# Patient Record
Sex: Female | Born: 1975 | Race: White | Hispanic: No | Marital: Married | Smoking: Never smoker
Health system: Southern US, Community
[De-identification: ages and names within clinical notes are randomized; demographics above are authoritative.]

## PROBLEM LIST (undated history)

## (undated) DIAGNOSIS — F419 Anxiety disorder, unspecified: Secondary | ICD-10-CM

## (undated) DIAGNOSIS — F5104 Psychophysiologic insomnia: Secondary | ICD-10-CM

## (undated) DIAGNOSIS — F909 Attention-deficit hyperactivity disorder, unspecified type: Secondary | ICD-10-CM

## (undated) DIAGNOSIS — N200 Calculus of kidney: Secondary | ICD-10-CM

## (undated) HISTORY — PX: TONSILLECTOMY: SUR1361

## (undated) HISTORY — PX: TUBAL LIGATION: SHX77

---

## 2004-12-16 ENCOUNTER — Ambulatory Visit: Payer: Self-pay

## 2005-05-07 ENCOUNTER — Ambulatory Visit: Payer: Self-pay | Admitting: Obstetrics & Gynecology

## 2005-07-22 ENCOUNTER — Inpatient Hospital Stay: Payer: Self-pay | Admitting: Obstetrics & Gynecology

## 2008-03-31 ENCOUNTER — Emergency Department (HOSPITAL_COMMUNITY): Admission: EM | Admit: 2008-03-31 | Discharge: 2008-03-31 | Payer: Self-pay | Admitting: Family Medicine

## 2008-06-30 ENCOUNTER — Emergency Department (HOSPITAL_COMMUNITY): Admission: EM | Admit: 2008-06-30 | Discharge: 2008-06-30 | Payer: Self-pay | Admitting: Family Medicine

## 2009-06-07 ENCOUNTER — Emergency Department (HOSPITAL_COMMUNITY): Admission: EM | Admit: 2009-06-07 | Discharge: 2009-06-07 | Payer: Self-pay | Admitting: Emergency Medicine

## 2010-11-10 ENCOUNTER — Inpatient Hospital Stay (INDEPENDENT_AMBULATORY_CARE_PROVIDER_SITE_OTHER)
Admission: RE | Admit: 2010-11-10 | Discharge: 2010-11-10 | Disposition: A | Discharge status: Home / Self Care | Payer: 59 | Source: Ambulatory Visit | Attending: Family Medicine | Admitting: Family Medicine

## 2010-11-10 DIAGNOSIS — J019 Acute sinusitis, unspecified: Secondary | ICD-10-CM

## 2010-11-10 DIAGNOSIS — J40 Bronchitis, not specified as acute or chronic: Secondary | ICD-10-CM

## 2011-02-10 IMAGING — CR DG CHEST 2V
2 series · 2 of 2 positions shown · non-contrast
Comparison: None.

CLINICAL DATA: 32-year-old female with fever, cough, congestion.
Right rib and sternum pain.

CHEST - 2 VIEW

[view not recorded (1 of 2)]
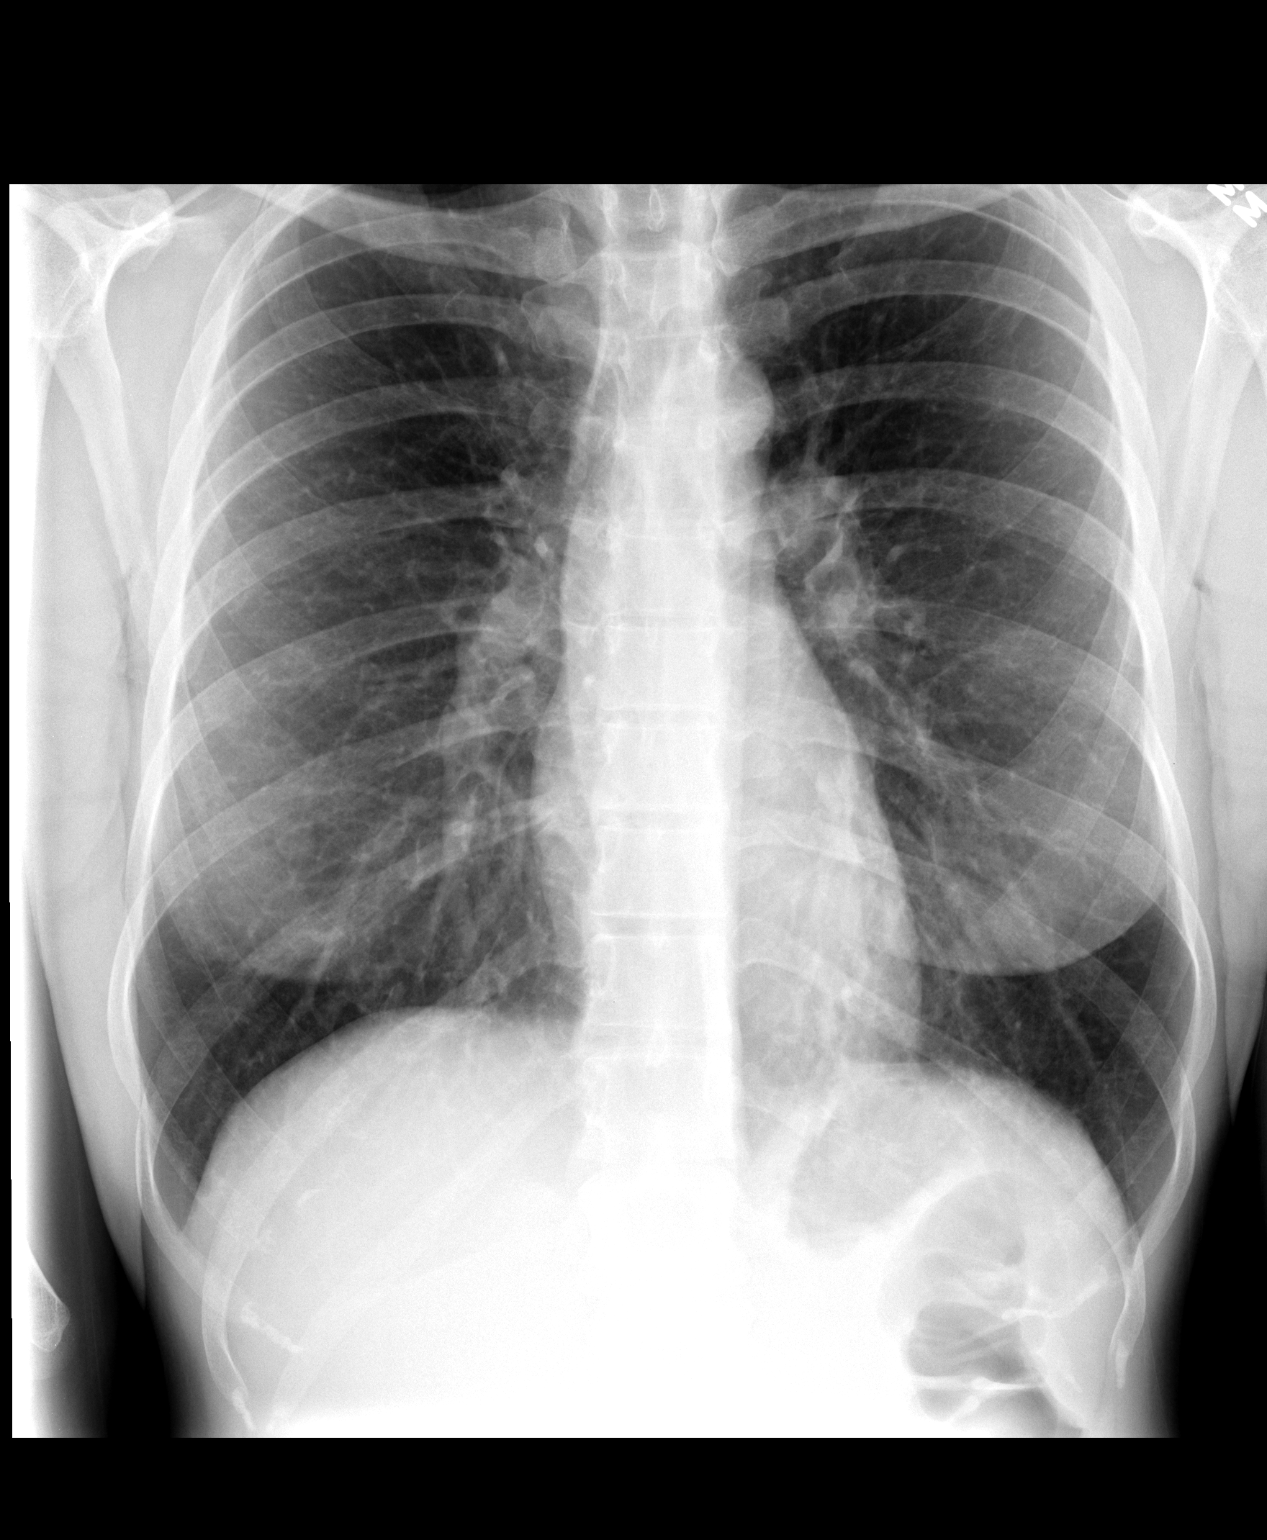

[view not recorded (2 of 2)]
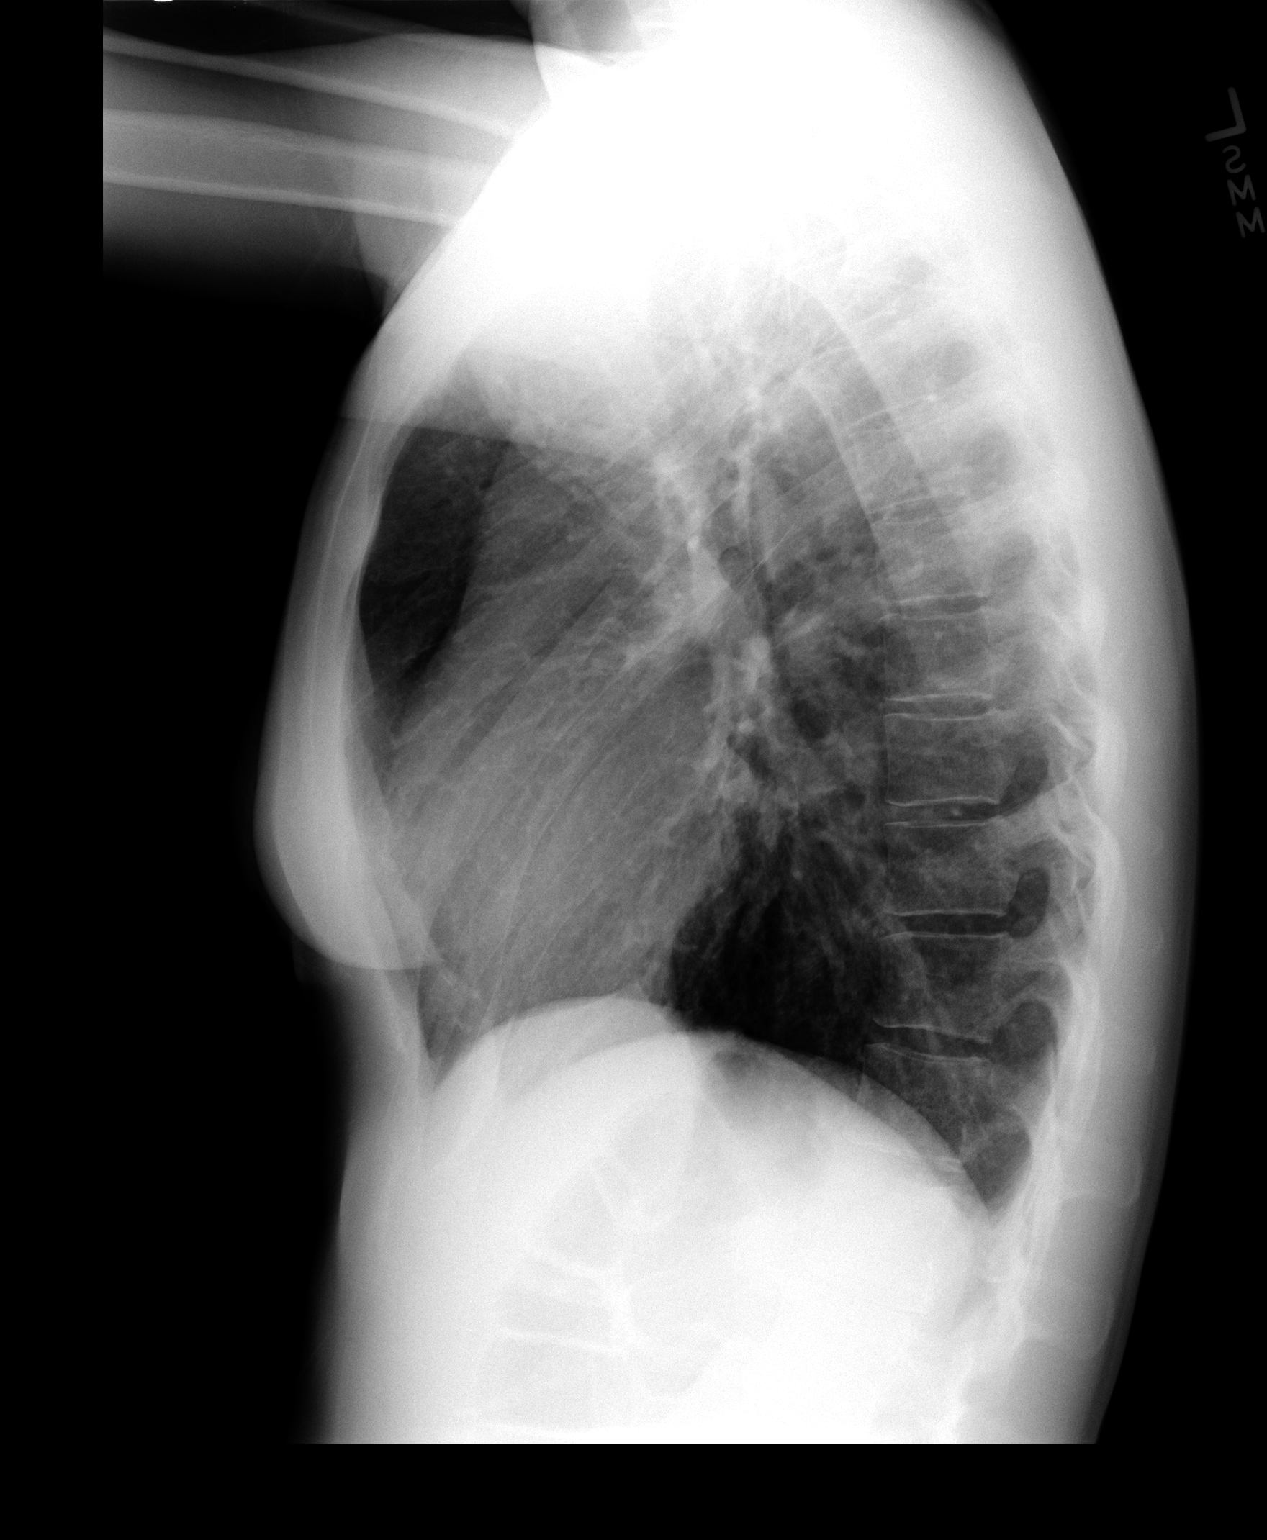

[2 of 2 positions shown; findings below may reference images not displayed]

FINDINGS: Normal cardiac size and mediastinal contours.  Anterior
clear space is preserved.  Lung volumes are within normal limits.
Visualized tracheal air column is within normal limits.  No
pneumothorax, pulmonary edema, pleural effusion, or consolidation.
No confluent airspace opacity, but there is mild diffuse increased
interstitial opacity. No acute osseous abnormality identified.  No
acute displaced rib fracture identified.
IMPRESSION: 1.  Mild diffuse increased interstitial markings may represent
acute viral / atypical pneumonia.
2. No acute displaced rib or other osseous fracture identified.

## 2011-04-01 ENCOUNTER — Ambulatory Visit: Payer: Self-pay | Admitting: Internal Medicine

## 2013-03-01 ENCOUNTER — Emergency Department (HOSPITAL_COMMUNITY)
Admission: EM | Admit: 2013-03-01 | Discharge: 2013-03-01 | Disposition: A | Discharge status: Home / Self Care | Payer: 59 | Source: Home / Self Care | Attending: Family Medicine | Admitting: Family Medicine

## 2013-03-01 ENCOUNTER — Encounter (HOSPITAL_COMMUNITY): Payer: Self-pay | Admitting: Emergency Medicine

## 2013-03-01 DIAGNOSIS — J029 Acute pharyngitis, unspecified: Secondary | ICD-10-CM

## 2013-03-01 LAB — POCT RAPID STREP A: STREPTOCOCCUS, GROUP A SCREEN (DIRECT): NEGATIVE

## 2013-03-01 NOTE — ED Provider Notes (Signed)
CSN: [id number]     Arrival date & time 03/01/13  1156 History   First MD Initiated Contact with Patient 03/01/13 1415     Chief Complaint  Patient presents with  . Sore Throat   (Consider location/radiation/quality/duration/timing/severity/associated sxs/prior Treatment) HPI Comments: 38 year old female presents complaining of sore throat. About a week and a half ago, she started to have some mild cold symptoms with dry cough and runny nose. These resolved, but then she started to get a sore throat. The sore throat started one week ago. She feels this mostly on the right side, and feels like her lymph nodes are swollen on the right side of her neck. She said this has gradually worsened over a week but is still very mild. No fevers, chills, NVD, shortness of breath, chest pain. She says overall she does not feel sick, but the sore throat is annoying her.  Patient is a 38 y.o. female presenting with pharyngitis.  Sore Throat Pertinent negatives include no chest pain, no abdominal pain and no shortness of breath.    History reviewed. No pertinent past medical history. History reviewed. No pertinent past surgical history. No family history on file. History  Substance Use Topics  . Smoking status: Not on file  . Smokeless tobacco: Not on file  . Alcohol Use: No   OB History   Grav Para Term Preterm Abortions TAB SAB Ect Mult Living                 Review of Systems  Constitutional: Negative for fever and chills.  HENT: Positive for rhinorrhea and sore throat.   Eyes: Negative for visual disturbance.  Respiratory: Positive for cough. Negative for shortness of breath.   Cardiovascular: Negative for chest pain, palpitations and leg swelling.  Gastrointestinal: Negative for nausea, vomiting and abdominal pain.  Endocrine: Negative for polydipsia and polyuria.  Genitourinary: Negative for dysuria, urgency and frequency.  Musculoskeletal: Negative for arthralgias and myalgias.  Skin:  Negative for rash.  Neurological: Negative for dizziness, weakness and light-headedness.    Allergies  Penicillins  Home Medications  No current outpatient prescriptions on file. BP 132/95  Pulse 95  Temp(Src) 98.2 F (36.8 C) (Oral)  Resp 18  SpO2 100% Physical Exam  Nursing note and vitals reviewed. Constitutional: She is oriented to person, place, and time. Vital signs are normal. She appears well-developed and well-nourished. No distress.  HENT:  Head: Normocephalic and atraumatic.  Nose: Nose normal. Right sinus exhibits no maxillary sinus tenderness and no frontal sinus tenderness. Left sinus exhibits no maxillary sinus tenderness and no frontal sinus tenderness.  Mouth/Throat: Uvula is midline and oropharynx is clear and moist. No oropharyngeal exudate, posterior oropharyngeal edema, posterior oropharyngeal erythema or tonsillar abscesses.  Neck: Normal range of motion. Neck supple.  Pulmonary/Chest: Effort normal. No respiratory distress.  Lymphadenopathy:    She has no cervical adenopathy.  Neurological: She is alert and oriented to person, place, and time. She has normal strength. Coordination normal.  Skin: Skin is warm and dry. No rash noted. She is not diaphoretic.  Psychiatric: She has a normal mood and affect. Judgment normal.    ED Course  Procedures (including critical care time) Labs Review Labs Reviewed  POCT RAPID STREP A ([facility]RG CARE ONLY)   Imaging Review No results found.    MDM   1. Viral pharyngitis    Physical exam is completely normal. Treat symptomatically. Followup in a few days if this continues to worsen  Graylon Good, PA-C 03/01/13 1504

## 2013-03-01 NOTE — ED Notes (Signed)
C/o blisters in the back of her throat, lasting about 1 week. Denies any pain at the moment, hurts when she swallows. Red and soreness, runny nose. Tried Tylenol with no relief. 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 Denies any other sx's. Written by: Marga MelnickQuaNeisha Jones, SMA

## 2013-03-01 NOTE — Discharge Instructions (Signed)
Antibiotic Nonuse  Your caregiver felt that the infection or problem was not one that would be helped with an antibiotic. Infections may be caused by viruses or bacteria. Only a caregiver can tell which one of these is the likely cause of an illness. A cold is the most common cause of infection in both adults and children. A cold is a virus. Antibiotic treatment will have no effect on a viral infection. Viruses can lead to many lost days of work caring for sick children and many missed days of school. Children may catch as many as 10 "colds" or "flus" per year during which they can be tearful, cranky, and uncomfortable. The goal of treating a virus is aimed at keeping the ill person comfortable. Antibiotics are medications used to help the body fight bacterial infections. There are relatively few types of bacteria that cause infections but there are hundreds of viruses. While both viruses and bacteria cause infection they are very different types of germs. A viral infection will typically go away by itself within 7 to 10 days. Bacterial infections may spread or get worse without antibiotic treatment. Examples of bacterial infections are:  Sore throats (like strep throat or tonsillitis).  Infection in the lung (pneumonia).  Ear and skin infections. Examples of viral infections are:  Colds or flus.  Most coughs and bronchitis.  Sore throats not caused by Strep.  Runny noses. It is often best not to take an antibiotic when a viral infection is the cause of the problem. Antibiotics can kill off the helpful bacteria that we have inside our body and allow harmful bacteria to start growing. Antibiotics can cause side effects such as allergies, nausea, and diarrhea without helping to improve the symptoms of the viral infection. Additionally, repeated uses of antibiotics can cause bacteria inside of our body to become resistant. That resistance can be passed onto harmful bacterial. The next time you have  an infection it may be harder to treat if antibiotics are used when they are not needed. Not treating with antibiotics allows our own immune system to develop and take care of infections more efficiently. Also, antibiotics will work better for [place] when they are prescribed for bacterial infections. Treatments for a child that is ill may include:  Give extra fluids throughout the day to stay hydrated.  Get plenty of rest.  Only give your child over-the-counter or prescription medicines for pain, discomfort, or fever as directed by your caregiver.  The use of a cool mist humidifier may help stuffy noses.  Cold medications if suggested by your caregiver. Your caregiver may decide to start you on an antibiotic if:  The problem you were seen for today continues for a longer length of time than expected.  You develop a secondary bacterial infection. SEEK MEDICAL CARE IF:  Fever lasts longer than 5 days.  Symptoms continue to get worse after 5 to 7 days or become severe.  Difficulty in breathing develops.  Signs of dehydration develop (poor drinking, rare urinating, dark colored urine).  Changes in behavior or worsening tiredness (listlessness or lethargy). Document Released: 04/26/2001 Document Revised: 05/10/2011 Document Reviewed: 10/23/2008 [facility] Patient Information 2014 [place], [place].  Viral and Bacterial Pharyngitis Pharyngitis is soreness (inflammation) or infection of the pharynx. It is also called a sore throat. CAUSES  Most sore throats are caused by viruses and are part of a cold. However, some sore throats are caused by strep and other bacteria. Sore throats can also be caused by post nasal  drip from draining sinuses, allergies and sometimes from sleeping with an open mouth. Infectious sore throats can be spread from person to person by coughing, sneezing and sharing cups or eating utensils. TREATMENT  Sore throats that are viral usually last 3-4 days. Viral illness will  get better without medications (antibiotics). Strep throat and other bacterial infections will usually begin to get better about 24-48 hours after you begin to take antibiotics. HOME CARE INSTRUCTIONS   If the caregiver feels there is a bacterial infection or if there is a positive strep test, they will prescribe an antibiotic. The full course of antibiotics must be taken. If the full course of antibiotic is not taken, you or your child may become ill again. If you or your child has strep throat and do not finish all of the medication, serious heart or kidney diseases may develop.  Drink enough water and fluids to keep your urine clear or pale yellow.  Only take over-the-counter or prescription medicines for pain, discomfort or fever as directed by your caregiver.  Get lots of rest.  Gargle with salt water ( tsp. of salt in a glass of water) as often as every 1-2 hours as you need for comfort.  Hard candies may soothe the throat if individual is not at risk for choking. Throat sprays or lozenges may also be used. SEEK MEDICAL CARE IF:   Large, tender lumps in the neck develop.  A rash develops.  Green, yellow-brown or bloody sputum is coughed up.  Your baby is older than 3 months with a rectal temperature of 100.5 F (38.1 C) or higher for more than 1 day. SEEK IMMEDIATE MEDICAL CARE IF:   A stiff neck develops.  You or your child are drooling or unable to swallow liquids.  You or your child are vomiting, unable to keep medications or liquids down.  You or your child has severe pain, unrelieved with recommended medications.  You or your child are having difficulty breathing (not due to stuffy nose).  You or your child are unable to fully open your mouth.  You or your child develop redness, swelling, or severe pain anywhere on the neck.  You have a fever.  Your baby is older than 3 months with a rectal temperature of 102 F (38.9 C) or higher.  Your baby is 353 months old  or younger with a rectal temperature of 100.4 F (38 C) or higher. MAKE SURE YOU:   Understand these instructions.  Will watch your condition.  Will get help right away if you are not doing well or get worse. Document Released: 02/15/2005 Document Revised: 05/10/2011 Document Reviewed: 05/15/2007 [facility] Patient Information 2014 [place], [place].

## 2013-03-02 NOTE — ED Provider Notes (Signed)
Medical screening examination/treatment/procedure(s) were performed by resident physician or non-physician practitioner and as supervising physician I was immediately available for consultation/collaboration.   Kayci Belleville DOUGLAS MD.   Jett Fukuda D Lenda Baratta, MD 03/02/13 0943 

## 2013-03-03 LAB — CULTURE, GROUP A STREP

## 2013-11-07 ENCOUNTER — Other Ambulatory Visit (HOSPITAL_COMMUNITY): Payer: Self-pay | Admitting: Neurosurgery

## 2013-11-07 DIAGNOSIS — M5416 Radiculopathy, lumbar region: Secondary | ICD-10-CM

## 2013-11-21 ENCOUNTER — Ambulatory Visit (HOSPITAL_COMMUNITY): Admission: RE | Admit: 2013-11-21 | Payer: 59 | Source: Ambulatory Visit

## 2015-03-30 ENCOUNTER — Encounter: Payer: Self-pay | Admitting: Gynecology

## 2015-03-30 ENCOUNTER — Ambulatory Visit
Admission: EM | Admit: 2015-03-30 | Discharge: 2015-03-30 | Disposition: A | Discharge status: Home / Self Care | Payer: 59 | Attending: Family Medicine | Admitting: Family Medicine

## 2015-03-30 DIAGNOSIS — J0100 Acute maxillary sinusitis, unspecified: Secondary | ICD-10-CM

## 2015-03-30 HISTORY — DX: Psychophysiologic insomnia: F51.04

## 2015-03-30 HISTORY — DX: Hypercalcemia: E83.52

## 2015-03-30 HISTORY — DX: Anxiety disorder, unspecified: F41.9

## 2015-03-30 HISTORY — DX: Calculus of kidney: N20.0

## 2015-03-30 HISTORY — DX: Attention-deficit hyperactivity disorder, unspecified type: F90.9

## 2015-03-30 MED ORDER — FLUTICASONE PROPIONATE 50 MCG/ACT NA SUSP: 2.0000 | Freq: Every day | NASAL | Status: DC

## 2015-03-30 MED ORDER — SUPRAX 400 MG PO TABS: 400.0000 mg | ORAL_TABLET | Freq: Every day | ORAL | Status: DC

## 2015-03-30 MED ORDER — FEXOFENADINE-PSEUDOEPHED ER 180-240 MG PO TB24: 1.0000 | ORAL_TABLET | Freq: Every day | ORAL | Status: DC

## 2015-03-30 MED ORDER — PREDNISONE 10 MG (21) PO TBPK: ORAL_TABLET | ORAL | Status: DC

## 2015-03-30 NOTE — ED Provider Notes (Signed)
CSN: [id number]     Arrival date & time 03/30/15  1106 History   First MD Initiated Contact with Patient 03/30/15 1201    Nurses notes were reviewed. Chief Complaint  Patient presents with  . Facial Pain   Patient with facial pain. She reports pain on the left side of her face. She reports having sinus trouble about 8 days ago last week Sunday. She thought things were getting better but they quickly returned about Wednesday last week. She's been using in a pot and last days and use some Afrin nasal spray without improvement. She normally takes Allegra on a regular basis but she needs something for sinuses but she doesn't usually have sinus infections. She does not smoke and does not live with her regular smoker either.  She is allergic penicillin history of some insomnia and anxiety.  No sniffing family medical history pertinent to this visit. No chronic medications at this time  (Consider location/radiation/quality/duration/timing/severity/associated sxs/prior Treatment) Patient is a 40 y.o. female presenting with URI. The history is provided by the patient. No language interpreter was used.  URI Presenting symptoms: congestion, cough, facial pain and rhinorrhea   Presenting symptoms: no ear pain, no fatigue, no fever and no sore throat   Severity:  Moderate Duration:  8 days Timing:  Constant Progression:  Worsening Chronicity:  New Relieved by:  Nothing Ineffective treatments:  Decongestant, inhaler and OTC medications (Netty pot ineffective as as afrain) Associated symptoms: headaches, sinus pain and swollen glands   Risk factors: diabetes mellitus   Risk factors: not elderly, no chronic respiratory disease, no immunosuppression and no recent illness     Past Medical History  Diagnosis Date  . ADHD (attention deficit hyperactivity disorder)   . Chronic insomnia   . Anxiety   . Hypercalcemia   . Recurrent nephrolithiasis    Past Surgical History  Procedure Laterality Date   . Tonsillectomy    . Tubal ligation     No family history on file. Social History  Substance Use Topics  . Smoking status: Never Smoker   . Smokeless tobacco: None  . Alcohol Use: No   OB History    No data available     Review of Systems  Constitutional: Negative for fever and fatigue.  HENT: Positive for congestion and rhinorrhea. Negative for ear pain and sore throat.   Respiratory: Positive for cough.   Neurological: Positive for headaches.  All other systems reviewed and are negative.   Allergies  Penicillins  Home Medications   Prior to Admission medications   Medication Sig Start Date End Date Taking? Authorizing Provider  amphetamine-dextroamphetamine (ADDERALL) 20 MG tablet Take 20 mg by mouth daily.   Yes Historical Provider, MD  fexofenadine-pseudoephedrine (ALLEGRA-D ALLERGY & CONGESTION) 180-240 MG 24 hr tablet Take 1 tablet by mouth daily. 03/30/15   Hassan Rowan, MD  fluticasone (FLONASE) 50 MCG/ACT nasal spray Place 2 sprays into both nostrils daily. 03/30/15   Hassan Rowan, MD  predniSONE (STERAPRED UNI-PAK 21 TAB) 10 MG (21) TBPK tablet Sig 6 tablet day 1, 5 tablets day 2, 4 tablets day 3,,3tablets day 4, 2 tablets day 5, 1 tablet day 6 take all tablets orally 03/30/15   Hassan Rowan, MD  SUPRAX 400 MG tablet Take 1 tablet (400 mg total) by mouth daily. Patient may have instructed Suprax 200 mg 2 chewable tablets daily #20 for 10 days if cost is an issue 03/30/15   Hassan Rowan, MD   Meds Ordered and Administered this  Visit  Medications - No data to display  BP 136/96 mmHg  Pulse 92  Temp(Src) 98.1 F (36.7 C) (Oral)  Resp 16  Ht  (1.651 m)  Wt 135 lb (61.236 kg)  BMI 22.47 kg/m2  SpO2 100%  LMP 03/23/2015 No data found.   Physical Exam  Constitutional: She is oriented to person, place, and time. She appears well-developed and well-nourished.  HENT:  Head: Normocephalic and atraumatic.  Right Ear: Hearing, tympanic membrane and external ear  normal.  Left Ear: Hearing, tympanic membrane and external ear normal.  Nose: Right sinus exhibits maxillary sinus tenderness. Left sinus exhibits maxillary sinus tenderness.  Mouth/Throat: Uvula is midline. Posterior oropharyngeal erythema present.  Increase swelling over the left side of her face and more tenderness over the left maxillary sinus area in the right  Neck: Normal range of motion.  Cardiovascular: Normal rate and regular rhythm.   Pulmonary/Chest: Effort normal and breath sounds normal.  Musculoskeletal: Normal range of motion.  Lymphadenopathy:    She has cervical adenopathy.  Neurological: She is alert and oriented to person, place, and time.  Skin: Skin is warm and dry.  Psychiatric: She has a normal mood and affect.  Vitals reviewed.   ED Course  Procedures (including critical care time)  Labs Review Labs Reviewed - No data to display  Imaging Review No results found.   Visual Acuity Review  Right Eye Distance:   Left Eye Distance:   Bilateral Distance:    Right Eye Near:   Left Eye Near:    Bilateral Near:         MDM   1. Acute maxillary sinusitis, recurrence not specified    Due to the significant symptoms of her sinus infection and her allergies penicillin will place on Suprax 400 mg a day for 10 days change Allegra to Allegra-D trace on oral prednisone and Flonase nasal spray. Follow-up with PCP of choice if not better in a week to 10 days work note given for today and tomorrow as well. Strongly suggest she stay away from Afrin nasal sprays as well.   Note: This dictation was prepared with Dragon dictation along with smaller phrase technology. Any transcriptional errors that result from this process are unintentional.       Hassan Rowan, MD 03/30/15 1312

## 2015-03-30 NOTE — ED Notes (Signed)
Patient c/o facial pressure / drainage x 1 week.

## 2015-03-30 NOTE — Discharge Instructions (Signed)
Sinusitis, Adult °Sinusitis is redness, soreness, and puffiness (inflammation) of the air pockets in the bones of your face (sinuses). The redness, soreness, and puffiness can cause air and mucus to get trapped in your sinuses. This can allow germs to grow and cause an infection.  °HOME CARE  °· Drink enough fluids to keep your pee (urine) clear or pale yellow. °· Use a humidifier in your home. °· Run a hot shower to create steam in the bathroom. Sit in the bathroom with the door closed. Breathe in the steam 3-4 times a day. °· Put a warm, moist washcloth on your face 3-4 times a day, or as told by your doctor. °· Use salt water sprays (saline sprays) to wet the thick fluid in your nose. This can help the sinuses drain. °· Only take medicine as told by your doctor. °GET HELP RIGHT AWAY IF:  °· Your pain gets worse. °· You have very bad headaches. °· You are sick to your stomach (nauseous). °· You throw up (vomit). °· You are very sleepy (drowsy) all the time. °· Your face is puffy (swollen). °· Your vision changes. °· You have a stiff neck. °· You have trouble breathing. °MAKE SURE YOU:  °· Understand these instructions. °· Will watch your condition. °· Will get help right away if you are not doing well or get worse. °  °This information is not intended to replace advice given to you by your health care provider. Make sure you discuss any questions you have with your health care provider. °  °Document Released: 08/04/2007 Document Revised: 03/08/2014 Document Reviewed: 09/21/2011 °Elsevier Interactive Patient Education ©2016 [employer] Inc. ° °Upper Respiratory Infection, Adult °Most upper respiratory infections (URIs) are caused by a virus. A URI affects the nose, throat, and upper air passages. The most common type of URI is often called "the common cold." °HOME CARE  °· Take medicines only as told by your doctor. °· Gargle warm saltwater or take cough drops to comfort your throat as told by your doctor. °· Use a  warm mist humidifier or inhale steam from a shower to increase air moisture. This may make it easier to breathe. °· Drink enough fluid to keep your pee (urine) clear or pale yellow. °· Eat soups and other clear broths. °· Have a healthy diet. °· Rest as needed. °· Go back to work when your fever is gone or your doctor says it is okay. °¨ You may need to stay home longer to avoid giving your URI to others. °¨ You can also wear a face mask and wash your hands often to prevent spread of the virus. °· Use your inhaler more if you have asthma. °· Do not use any tobacco products, including cigarettes, chewing tobacco, or electronic cigarettes. If you need help quitting, ask your doctor. °GET HELP IF: °· You are getting worse, not better. °· Your symptoms are not helped by medicine. °· You have chills. °· You are getting more short of breath. °· You have brown or red mucus. °· You have yellow or brown discharge from your nose. °· You have pain in your face, especially when you bend forward. °· You have a fever. °· You have puffy (swollen) neck glands. °· You have pain while swallowing. °· You have white areas in the back of your throat. °GET HELP RIGHT AWAY IF:  °· You have very bad or constant: °¨ Headache. °¨ Ear pain. °¨ Pain in your forehead, behind your eyes, and over   your cheekbones (sinus pain). °¨ Chest pain. °· You have long-lasting (chronic) lung disease and any of the following: °¨ Wheezing. °¨ Long-lasting cough. °¨ Coughing up blood. °¨ A change in your usual mucus. °· You have a stiff neck. °· You have changes in your: °¨ Vision. °¨ Hearing. °¨ Thinking. °¨ Mood. °MAKE SURE YOU:  °· Understand these instructions. °· Will watch your condition. °· Will get help right away if you are not doing well or get worse. °  °This information is not intended to replace advice given to you by your health care provider. Make sure you discuss any questions you have with your health care provider. °  °Document Released:  08/04/2007 Document Revised: 07/02/2014 Document Reviewed: 05/23/2013 °Elsevier Interactive Patient Education ©2016 Elsevier Inc. ° °

## 2015-04-02 DIAGNOSIS — J019 Acute sinusitis, unspecified: Secondary | ICD-10-CM | POA: Diagnosis not present

## 2015-04-02 DIAGNOSIS — F988 Other specified behavioral and emotional disorders with onset usually occurring in childhood and adolescence: Secondary | ICD-10-CM | POA: Diagnosis not present

## 2015-04-02 DIAGNOSIS — F909 Attention-deficit hyperactivity disorder, unspecified type: Secondary | ICD-10-CM | POA: Diagnosis not present

## 2015-04-02 DIAGNOSIS — B9689 Other specified bacterial agents as the cause of diseases classified elsewhere: Secondary | ICD-10-CM | POA: Diagnosis not present

## 2015-04-16 MED FILL — levoFLOXacin 500 MG TABS: 500 | 10 days supply | Qty: 10 | Fill #0

## 2015-07-16 MED FILL — DEXTROAMP-AMPHETAMIN 20 MG: 20 | 30 days supply | Qty: 60 | Fill #0

## 2015-09-30 DIAGNOSIS — F902 Attention-deficit hyperactivity disorder, combined type: Secondary | ICD-10-CM | POA: Diagnosis not present

## 2015-09-30 DIAGNOSIS — F5104 Psychophysiologic insomnia: Secondary | ICD-10-CM | POA: Diagnosis not present

## 2015-09-30 DIAGNOSIS — F909 Attention-deficit hyperactivity disorder, unspecified type: Secondary | ICD-10-CM | POA: Diagnosis not present

## 2016-03-05 DIAGNOSIS — F902 Attention-deficit hyperactivity disorder, combined type: Secondary | ICD-10-CM | POA: Diagnosis not present

## 2016-03-05 DIAGNOSIS — Z79899 Other long term (current) drug therapy: Secondary | ICD-10-CM | POA: Diagnosis not present

## 2016-03-05 DIAGNOSIS — F418 Other specified anxiety disorders: Secondary | ICD-10-CM | POA: Diagnosis not present

## 2016-03-05 DIAGNOSIS — Z0000 Encounter for general adult medical examination without abnormal findings: Secondary | ICD-10-CM | POA: Diagnosis not present

## 2016-03-09 DIAGNOSIS — Z13220 Encounter for screening for lipoid disorders: Secondary | ICD-10-CM | POA: Diagnosis not present

## 2016-03-09 DIAGNOSIS — R300 Dysuria: Secondary | ICD-10-CM | POA: Diagnosis not present

## 2016-03-09 DIAGNOSIS — Z79899 Other long term (current) drug therapy: Secondary | ICD-10-CM | POA: Diagnosis not present

## 2016-03-09 DIAGNOSIS — Z1329 Encounter for screening for other suspected endocrine disorder: Secondary | ICD-10-CM | POA: Diagnosis not present

## 2016-06-18 MED FILL — ALPRAZolam 0.5 MG TABS: 0.5 | 90 days supply | Qty: 180 | Fill #0

## 2016-06-18 MED FILL — AMPHETAMINE SALTS 20 MG TAB: 20 | 30 days supply | Qty: 60 | Fill #0

## 2016-09-07 DIAGNOSIS — F902 Attention-deficit hyperactivity disorder, combined type: Secondary | ICD-10-CM | POA: Diagnosis not present

## 2016-09-07 DIAGNOSIS — F418 Other specified anxiety disorders: Secondary | ICD-10-CM | POA: Diagnosis not present

## 2017-03-25 DIAGNOSIS — F5104 Psychophysiologic insomnia: Secondary | ICD-10-CM | POA: Diagnosis not present

## 2017-03-25 DIAGNOSIS — Z1231 Encounter for screening mammogram for malignant neoplasm of breast: Secondary | ICD-10-CM | POA: Diagnosis not present

## 2017-03-25 DIAGNOSIS — F902 Attention-deficit hyperactivity disorder, combined type: Secondary | ICD-10-CM | POA: Diagnosis not present

## 2017-03-25 DIAGNOSIS — F418 Other specified anxiety disorders: Secondary | ICD-10-CM | POA: Diagnosis not present

## 2017-03-28 DIAGNOSIS — Z79899 Other long term (current) drug therapy: Secondary | ICD-10-CM | POA: Diagnosis not present

## 2017-03-28 DIAGNOSIS — Z13220 Encounter for screening for lipoid disorders: Secondary | ICD-10-CM | POA: Diagnosis not present

## 2017-03-28 DIAGNOSIS — N390 Urinary tract infection, site not specified: Secondary | ICD-10-CM | POA: Diagnosis not present

## 2019-12-07 ENCOUNTER — Ambulatory Visit: Payer: 59 | Attending: Internal Medicine

## 2019-12-07 ENCOUNTER — Other Ambulatory Visit: Payer: Self-pay | Admitting: Internal Medicine

## 2019-12-07 DIAGNOSIS — Z23 Encounter for immunization: Secondary | ICD-10-CM

## 2019-12-07 NOTE — Progress Notes (Signed)
   Covid-19 Vaccination Clinic  Name:  Yvonne Snyder    MRN: [id number] DOB: 1975/10/23  12/07/2019  Yvonne Snyder was observed post Covid-19 immunization for 15 minutes without incident. She was provided with Vaccine Information Sheet and instruction to access the V-Safe system.   Yvonne Snyder was instructed to call 911 with any severe reactions post vaccine: Marland Kitchen Difficulty breathing  . Swelling of face and throat  . A fast heartbeat  . A bad rash all over body  . Dizziness and weakness   Immunizations Administered    Name Date Dose VIS Date Route   Pfizer COVID-19 Vaccine 12/07/2019 11:42 AM 0.3 mL 04/25/2018 Intramuscular   Manufacturer: [employer], [employer]   Lot: [id number]   NDC: [address]-2

## 2019-12-28 ENCOUNTER — Other Ambulatory Visit: Payer: Self-pay | Admitting: Internal Medicine

## 2019-12-28 ENCOUNTER — Ambulatory Visit: Payer: 59 | Attending: Internal Medicine

## 2019-12-28 DIAGNOSIS — Z23 Encounter for immunization: Secondary | ICD-10-CM

## 2019-12-28 NOTE — Progress Notes (Signed)
   Covid-19 Vaccination Clinic  Name:  AUSTRALIA DROLL    MRN: [id number] DOB: 08/13/1975  12/28/2019  Ms. Karnes was observed post Covid-19 immunization for 15 minutes without incident. She was provided with Vaccine Information Sheet and instruction to access the V-Safe system.   Ms. Farfan was instructed to call 911 with any severe reactions post vaccine: Marland Kitchen Difficulty breathing  . Swelling of face and throat  . A fast heartbeat  . A bad rash all over body  . Dizziness and weakness   Immunizations Administered    Name Date Dose VIS Date Route   Pfizer COVID-19 Vaccine 12/28/2019  9:01 AM 0.3 mL 12/19/2019 Intramuscular   Manufacturer: [employer], [employer]   Lot: [id number]   NDC: [address]-9

## 2020-01-23 ENCOUNTER — Other Ambulatory Visit: Payer: Self-pay | Admitting: Family Medicine

## 2020-05-28 ENCOUNTER — Other Ambulatory Visit: Payer: Self-pay | Admitting: Internal Medicine

## 2020-05-28 DIAGNOSIS — Z1231 Encounter for screening mammogram for malignant neoplasm of breast: Secondary | ICD-10-CM

## 2021-02-18 ENCOUNTER — Other Ambulatory Visit: Payer: Self-pay

## 2021-02-18 ENCOUNTER — Emergency Department: Payer: Medicaid Other

## 2021-02-18 ENCOUNTER — Encounter: Payer: Self-pay | Admitting: Emergency Medicine

## 2021-02-18 DIAGNOSIS — I82622 Acute embolism and thrombosis of deep veins of left upper extremity: Secondary | ICD-10-CM | POA: Diagnosis not present

## 2021-02-18 DIAGNOSIS — M79602 Pain in left arm: Secondary | ICD-10-CM | POA: Diagnosis present

## 2021-02-18 DIAGNOSIS — E876 Hypokalemia: Secondary | ICD-10-CM | POA: Insufficient documentation

## 2021-02-18 NOTE — ED Triage Notes (Signed)
Pt reports that she has been moving over the last few days, she noticed that today her left arm was swollen.She reports that it is not painful. Her whole arm is swollen.  No known injury

## 2021-02-19 ENCOUNTER — Emergency Department
Admission: EM | Admit: 2021-02-19 | Discharge: 2021-02-19 | Disposition: A | Discharge status: Home / Self Care | Payer: Medicaid Other | Attending: Emergency Medicine | Admitting: Emergency Medicine

## 2021-02-19 DIAGNOSIS — E876 Hypokalemia: Secondary | ICD-10-CM

## 2021-02-19 DIAGNOSIS — I82622 Acute embolism and thrombosis of deep veins of left upper extremity: Secondary | ICD-10-CM

## 2021-02-19 LAB — BASIC METABOLIC PANEL
Anion gap: 6 (ref 5–15)
BUN: 12 mg/dL (ref 6–20)
CO2: 31 mmol/L (ref 22–32)
Calcium: 9.7 mg/dL (ref 8.9–10.3)
Chloride: 98 mmol/L (ref 98–111)
Creatinine, Ser: 0.72 mg/dL (ref 0.44–1.00)
GFR, Estimated: >60 mL/min (ref >60)
Glucose, Bld: 74 mg/dL (ref 70–99)
Potassium: 2.7 mmol/L — CL (ref 3.5–5.1)
Sodium: 135 mmol/L (ref 135–145)

## 2021-02-19 LAB — CBC WITH DIFFERENTIAL/PLATELET
Abs Immature Granulocytes: 0.01 10*3/uL (ref 0.00–0.07)
Basophils Absolute: 0.1 10*3/uL (ref 0.0–0.1)
Basophils Relative: 1 %
Eosinophils Absolute: 0.4 10*3/uL (ref 0.0–0.5)
Eosinophils Relative: 6 %
HCT: 35.9 % — ABNORMAL LOW (ref 36.0–46.0)
Hemoglobin: 11.8 g/dL — ABNORMAL LOW (ref 12.0–15.0)
Immature Granulocytes: 0 %
Lymphocytes Relative: 20 %
Lymphs Abs: 1.3 10*3/uL (ref 0.7–4.0)
MCH: 30.5 pg (ref 26.0–34.0)
MCHC: 32.9 g/dL (ref 30.0–36.0)
MCV: 92.8 fL (ref 80.0–100.0)
Monocytes Absolute: 0.6 10*3/uL (ref 0.1–1.0)
Monocytes Relative: 9 %
Neutro Abs: 4.1 10*3/uL (ref 1.7–7.7)
Neutrophils Relative %: 64 %
Platelets: 457 10*3/uL — ABNORMAL HIGH (ref 150–400)
RBC: 3.87 MIL/uL (ref 3.87–5.11)
RDW: 14.4 % (ref 11.5–15.5)
WBC: 6.5 10*3/uL (ref 4.0–10.5)
nRBC: 0 % (ref 0.0–0.2)

## 2021-02-19 LAB — MAGNESIUM: Magnesium: 2 mg/dL (ref 1.7–2.4)

## 2021-02-19 MED ORDER — POTASSIUM CHLORIDE CRYS ER 20 MEQ PO TBCR
80.0000 meq | EXTENDED_RELEASE_TABLET | Freq: Once | ORAL | Status: AC
  Administered 2021-02-19: 03:00:00 80 meq via ORAL
  Filled 2021-02-19: qty 4

## 2021-02-19 MED ORDER — APIXABAN (ELIQUIS) VTE STARTER PACK (10MG AND 5MG): ORAL_TABLET | ORAL | 0 refills | Status: AC

## 2021-02-19 MED ORDER — POTASSIUM CHLORIDE 20 MEQ PO PACK: 20.0000 meq | PACK | Freq: Two times a day (BID) | ORAL | 0 refills | Status: AC

## 2021-02-19 MED ORDER — APIXABAN 5 MG PO TABS
10.0000 mg | ORAL_TABLET | ORAL | Status: AC
  Administered 2021-02-19: 03:00:00 10 mg via ORAL
  Filled 2021-02-19: qty 2

## 2021-02-19 NOTE — ED Provider Notes (Signed)
[facility] Emergency Department Provider Note  ____________________________________________   Event Date/Time   First MD Initiated Contact with Patient 02/19/21 0128     (approximate)  I have reviewed the triage vital signs and the nursing notes.   HISTORY  Chief Complaint swelling in arm   HPI Yvonne Snyder is a 45 y.o. female with below noted past medical history who presents for assessment of 3-4 day of pain and swelling in the left upper extremity.  Patient has been moving over the last few days using using her arm a fair amount but does not recall any injuries.  No swelling in the right upper extremity legs or elsewhere.  No prior similar episodes.  Denies any chest pain, shortness of breath, back pain, headache, earache, sore throat, fevers, cough, nausea, vomiting, diarrhea, burning with urination, rash or any other acute concerns at this time.  She has never had a DVT/PE.  She does not smoke.  She does not take any exogenous estrogen.  She does not have any significant pain associate with this.  No other acute concerns at this time.         Past Medical History:  Diagnosis Date   ADHD (attention deficit hyperactivity disorder)    Anxiety    Chronic insomnia    Hypercalcemia    Recurrent nephrolithiasis     There are no problems to display for this patient.   Past Surgical History:  Procedure Laterality Date   TONSILLECTOMY     TUBAL LIGATION      Prior to Admission medications   Medication Sig Start Date End Date Taking? Authorizing Provider  amphetamine-dextroamphetamine (ADDERALL XR) 30 MG 24 hr capsule Take 30 mg by mouth daily. 02/13/21  Yes [provider]  APIXABAN Everlene Balls) VTE STARTER PACK (10MG  AND 5MG ) Take as directed on package: start with two-5mg  tablets twice daily for 7 days. On day 8, switch to one-5mg  tablet twice daily. 02/19/21  Yes , MD  bisoprolol-hydrochlorothiazide [facility]) 2.5-6.25 MG tablet  Take 1 tablet by mouth daily. 01/24/21  Yes [provider]  escitalopram (LEXAPRO) 20 MG tablet Take 20 mg by mouth daily. 11/14/20  Yes [provider]  ezetimibe (ZETIA) 10 MG tablet Take 10 mg by mouth daily. 01/16/21  Yes [provider]  potassium chloride (KLOR-CON) 20 MEQ packet Take 20 mEq by mouth 2 (two) times daily for 3 doses. 02/19/21 02/20/21 Yes 02/21/21, MD    Allergies Penicillins  No family history on file.  Social History Social History   Tobacco Use   Smoking status: Never  Substance Use Topics   Alcohol use: No   Drug use: No    Review of Systems  Review of Systems  Constitutional:  Negative for chills and fever.  HENT:  Negative for sore throat.   Eyes:  Negative for pain.  Respiratory:  Negative for cough and stridor.   Cardiovascular:  Negative for chest pain.  Gastrointestinal:  Negative for vomiting.  Genitourinary:  Negative for dysuria.  Musculoskeletal:  Negative for myalgias.  Skin:  Negative for rash.  Neurological:  Negative for seizures, loss of consciousness and headaches.  Psychiatric/Behavioral:  Negative for suicidal ideas.   All other systems reviewed and are negative.    ____________________________________________   PHYSICAL EXAM:  VITAL SIGNS: ED Triage Vitals  Enc Vitals Group     BP 02/18/21 2029 125/80     Pulse Rate 02/18/21 2029 90  Resp 02/18/21 2029 20     Temp 02/18/21 2029 98.5 F (36.9 C)     Temp Source 02/18/21 2029 Oral     SpO2 02/18/21 2029 99 %     Weight 02/18/21 2030 130 lb (59 kg)     Height 02/18/21 2030 5\' 5"  (1.651 m)     Head Circumference --      Peak Flow --      Pain Score 02/18/21 2030 2     Pain Loc --      Pain Edu? --      Excl. in GC? --    Vitals:   02/19/21 0200 02/19/21 0230  BP: 119/82 112/83  Pulse:  70  Resp:  19  Temp:    SpO2: 100% 100%   Physical Exam Vitals and nursing note reviewed.  Constitutional:      General: She is not  in acute distress.    Appearance: She is well-developed.  HENT:     Head: Normocephalic and atraumatic.     Right Ear: External ear normal.     Left Ear: External ear normal.     Nose: Nose normal.  Eyes:     Conjunctiva/sclera: Conjunctivae normal.  Cardiovascular:     Rate and Rhythm: Normal rate and regular rhythm.     Heart sounds: No murmur heard. Pulmonary:     Effort: Pulmonary effort is normal. No respiratory distress.     Breath sounds: Normal breath sounds.  Abdominal:     Palpations: Abdomen is soft.     Tenderness: There is no abdominal tenderness.  Musculoskeletal:        General: No swelling.     Cervical back: Neck supple.  Skin:    General: Skin is warm and dry.     Capillary Refill: Capillary refill takes less than 2 seconds.  Neurological:     Mental Status: She is alert.  Psychiatric:        Mood and Affect: Mood normal.    Left upper extremity is fairly circumferentially edematous from the forearm to the shoulder.  2+ radial pulse.  Sensation intact light touch throughout.  Patient has symmetric grip strength compared to the right.  No obvious trauma on exam for deformity or other overlying skin changes. ____________________________________________   LABS (all labs ordered are listed, but only abnormal results are displayed)  Labs Reviewed  CBC WITH DIFFERENTIAL/PLATELET - Abnormal; Notable for the following components:      Result Value   Hemoglobin 11.8 (*)    HCT 35.9 (*)    Platelets 457 (*)    All other components within normal limits  BASIC METABOLIC PANEL - Abnormal; Notable for the following components:   Potassium 2.7 (*)    All other components within normal limits  MAGNESIUM   ____________________________________________  EKG    RADIOLOGY  ED MD interpretation: Left upper extremity ultrasound shows evidence of DVT involving the left cephalic, left axillary and left subclavian veins.  No other acute process noted.  Official  radiology report(s): [id number] Venous Img Upper Uni Left  Result Date: 02/19/2021 CLINICAL DATA:  Soft tissue swelling in the left arm EXAM: LEFT UPPER EXTREMITY VENOUS DOPPLER ULTRASOUND TECHNIQUE: Gray-scale sonography with graded compression, as well as color Doppler and duplex ultrasound were performed to evaluate the upper extremity deep venous system from the level of the subclavian vein and including the jugular, axillary, basilic, radial, ulnar and upper cephalic vein. Spectral Doppler was utilized to evaluate flow  at rest and with distal augmentation maneuvers. COMPARISON:  None. FINDINGS: Contralateral Subclavian Vein: Respiratory phasicity is normal and symmetric with the symptomatic side. No evidence of thrombus. Normal compressibility. Internal Jugular Vein: No evidence of thrombus. Normal compressibility, respiratory phasicity and response to augmentation. Subclavian Vein: Thrombus is noted with decreased compressibility in the subclavian vein. Axillary Vein: Thrombus is noted with decreased compressibility in the axillary vein. Cephalic Vein: Thrombus is noted within the central portion of the cephalic vein. Basilic Vein: No evidence of thrombus. Normal compressibility, respiratory phasicity and response to augmentation. Brachial Veins: No evidence of thrombus. Normal compressibility, respiratory phasicity and response to augmentation. Radial Veins: No evidence of thrombus. Normal compressibility, respiratory phasicity and response to augmentation. Ulnar Veins: No evidence of thrombus. Normal compressibility, respiratory phasicity and response to augmentation. Venous Reflux:  None visualized. Other Findings:  None visualized. IMPRESSION: Thrombus involving the left cephalic, left axillary and left subclavian veins. Electronically Signed   By: Alcide Clever M.D.   On: 02/19/2021 00:31    ____________________________________________   PROCEDURES  Procedure(s) performed (including Critical  Care):  Procedures   ____________________________________________   INITIAL IMPRESSION / ASSESSMENT AND PLAN / ED COURSE      Patient presents with above-stated history exam for assessment of some nontraumatic painless swelling in the left upper extremity which he has been using more than usual last couple days helping move boxes.  On arrival she is afebrile hemodynamically stable.  She does have fairly diffuse edema throughout the left upper extremity which is otherwise neurovascularly intact.  There is no evidence of a cellulitis whether his edema compartments are fairly soft and I have low suspicion for compartment syndrome at this time.  Left upper extremity ultrasound shows evidence of DVT involving the left cephalic, left axillary and left subclavian veins.  No other acute process noted.  I suspect this is likely the etiology of patient's presentation.  CBC shows no leukocytosis and hemoglobin of 11.8.  Platelets are 457.  Magnesium is within normal limits.  BMP shows a potassium of 2.7 without any other significant electrolyte or metabolic derangements.  This seems to be asymptomatic and incidental at this time.  We will give 80 mg of the emergency room and prescribe 3 days of supplementation and have patient have it rechecked by her PCP in 2 to 3 days.  Otherwise I have low suspicion for PE other significant or immediate life-threatening process I think patient stable for discharge with close outpatient follow-up.  Rx written for potassium and Eliquis.  Discharged in stable condition.      ____________________________________________   FINAL CLINICAL IMPRESSION(S) / ED DIAGNOSES  Final diagnoses:  Acute deep vein thrombosis (DVT) of left upper extremity, unspecified vein (HCC)  Hypokalemia    Medications  potassium chloride SA (KLOR-CON M) CR tablet 80 mEq (80 mEq Oral Given 02/19/21 0255)  apixaban (ELIQUIS) tablet 10 mg (10 mg Oral Given 02/19/21 0256)     ED Discharge  Orders          Ordered    potassium chloride (KLOR-CON) 20 MEQ packet  2 times daily        02/19/21 0257    APIXABAN (ELIQUIS) VTE STARTER PACK (10MG  AND 5MG )        02/19/21 0257             Note:  This document was prepared using Dragon voice recognition software and may include unintentional dictation errors.    , MD 02/19/21  0318 ° °

## 2021-02-19 NOTE — Discharge Instructions (Addendum)
Your potassium was fairly low today at 2.7.  We did give you some potassium in the emergency room but it is very important to have your potassium level rechecked in 2 or 3 days.

## 2022-07-28 ENCOUNTER — Other Ambulatory Visit: Payer: Self-pay | Admitting: Internal Medicine

## 2022-07-28 DIAGNOSIS — Z1231 Encounter for screening mammogram for malignant neoplasm of breast: Secondary | ICD-10-CM

## 2023-10-01 IMAGING — US US EXTREM  UP VENOUS*L*
1 series · 13 of 24 positions shown · non-contrast
Comparison: None.

CLINICAL DATA: Soft tissue swelling in the left arm



[Series 1: us venous img upper uni left (dvt) · portal-venous · 13 of 39 slices shown]
[im 1/39]
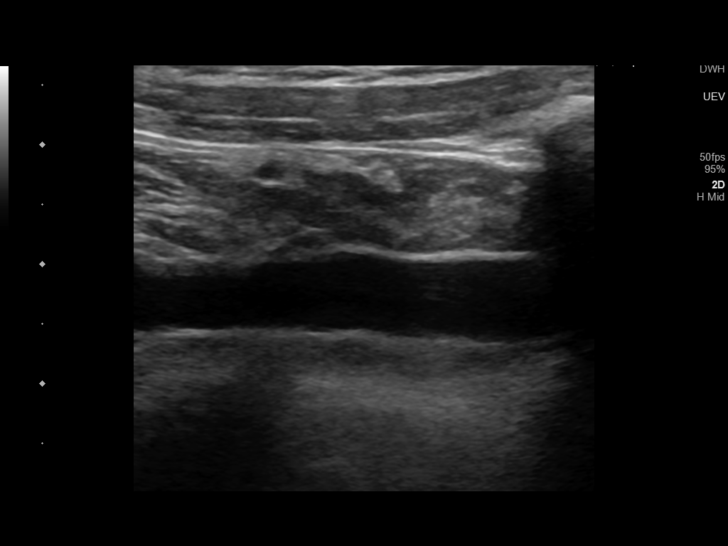
[im 4/39]
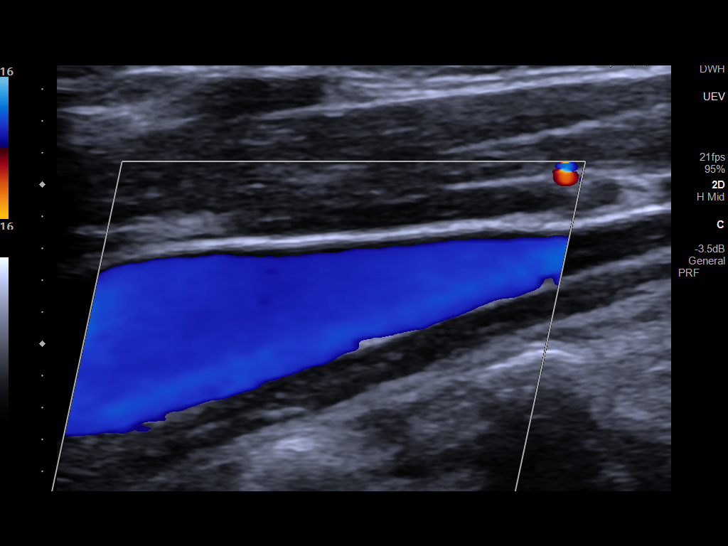
[im 7/39]
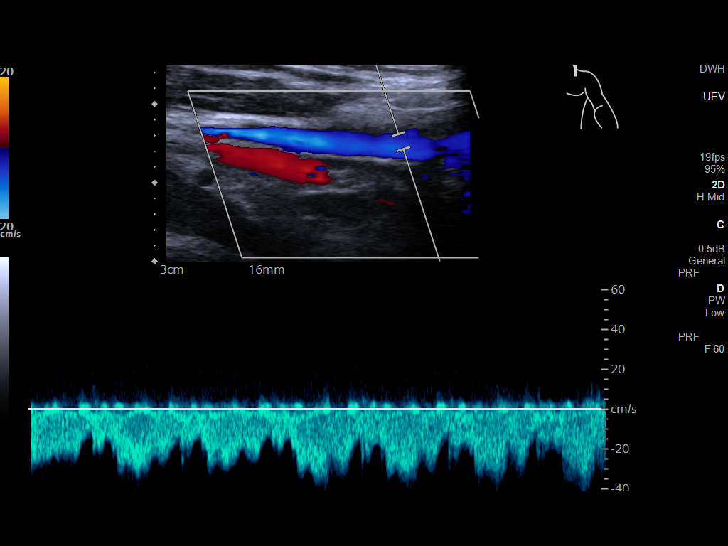
[im 10/39]
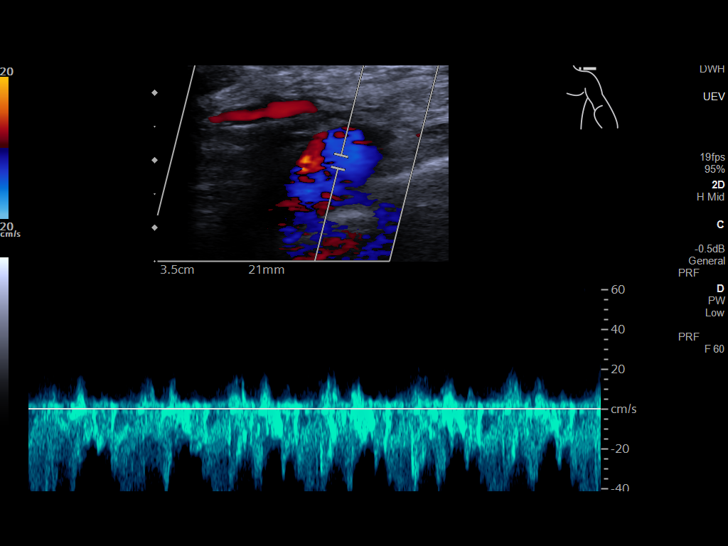
[im 14/39]
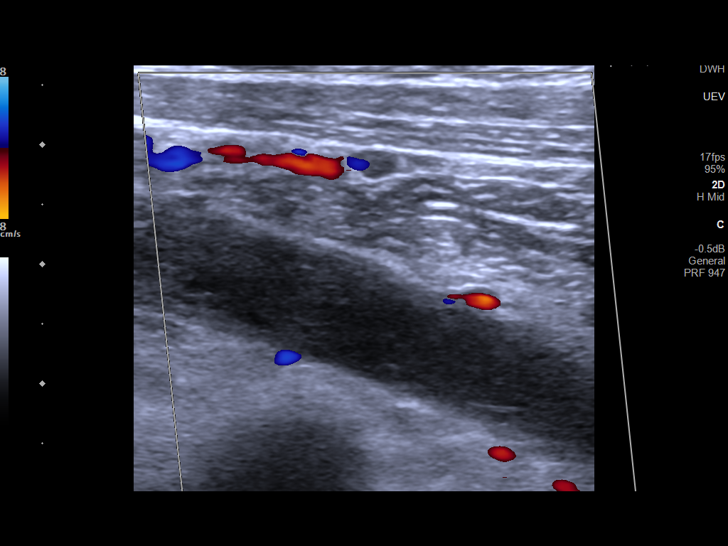
[im 17/39]
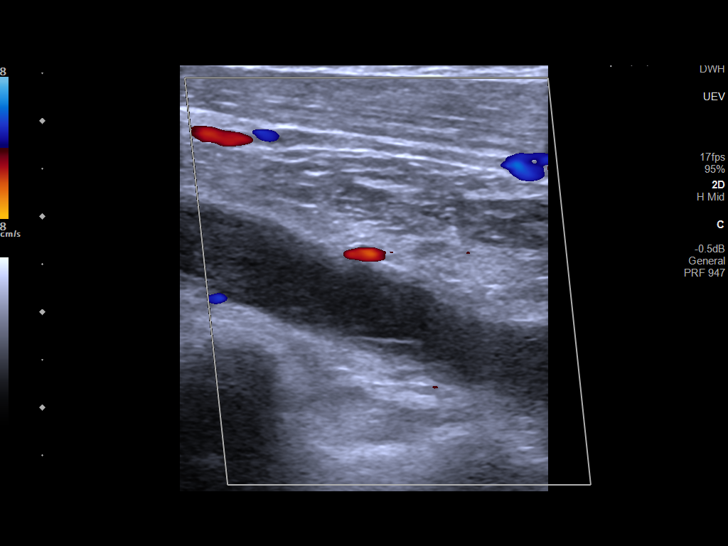
[im 20/39]
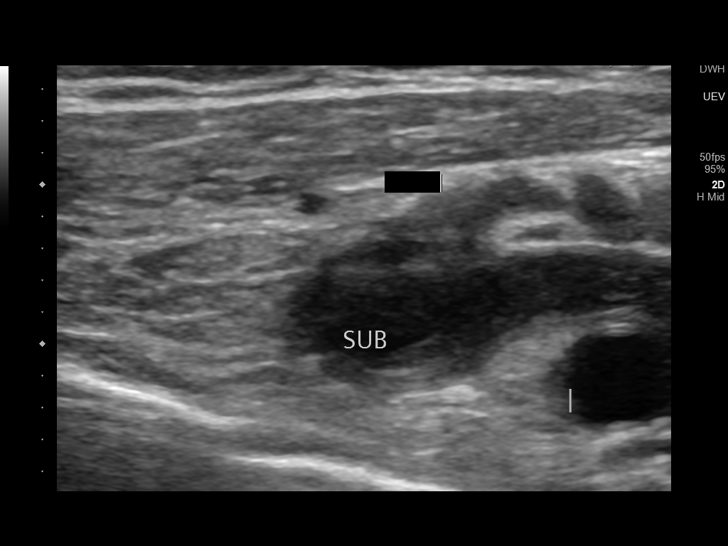
[im 22/39]
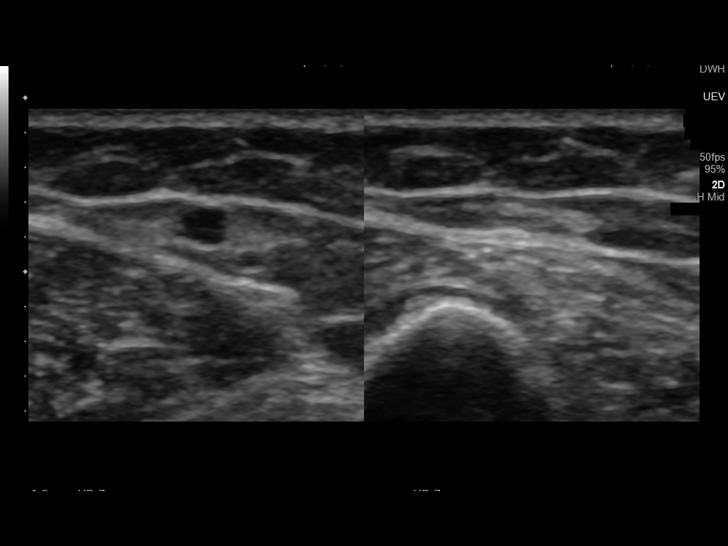
[im 25/39]
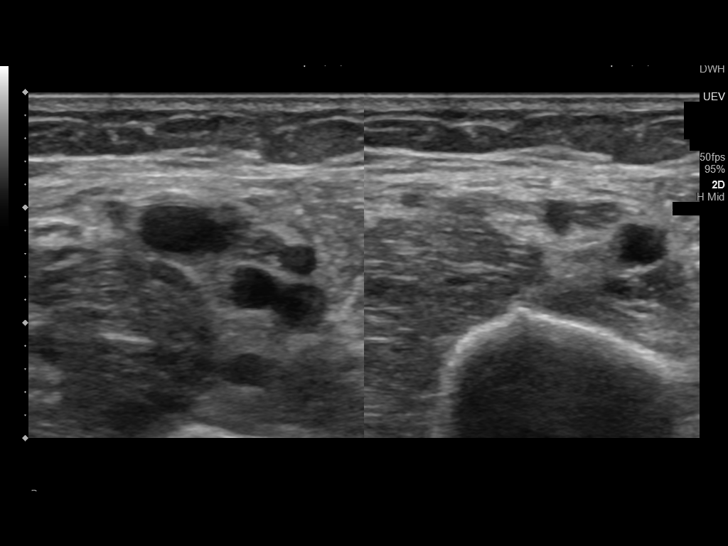
[im 29/39]
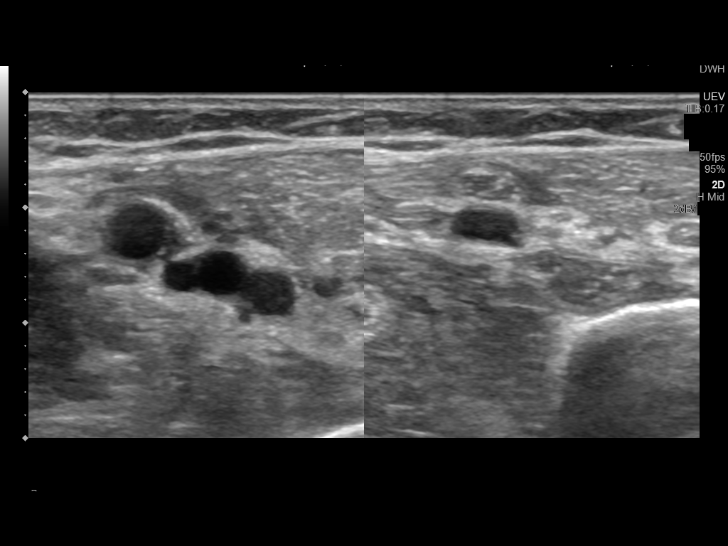
[im 32/39]
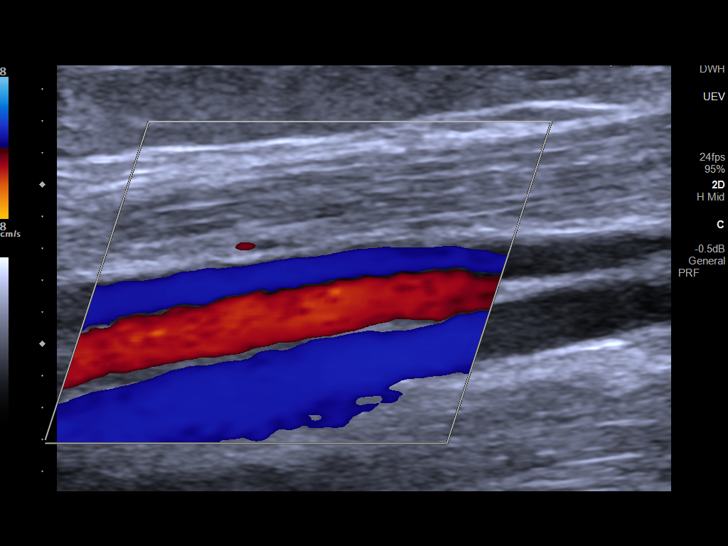
[im 35/39]
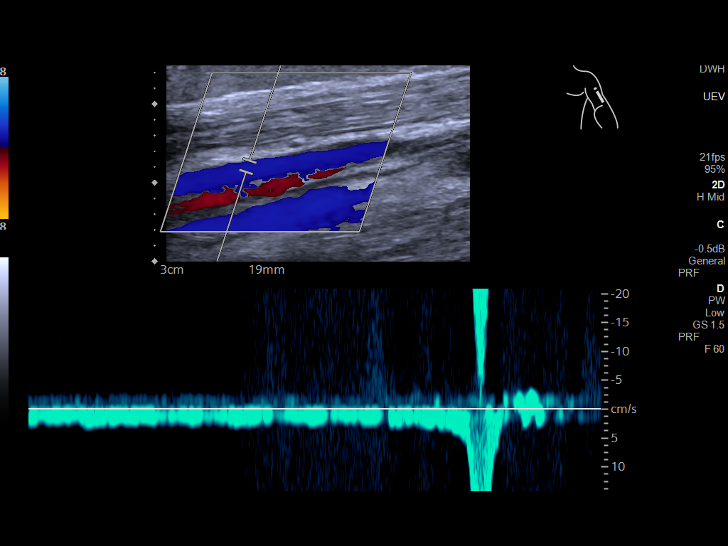
[im 39/39]
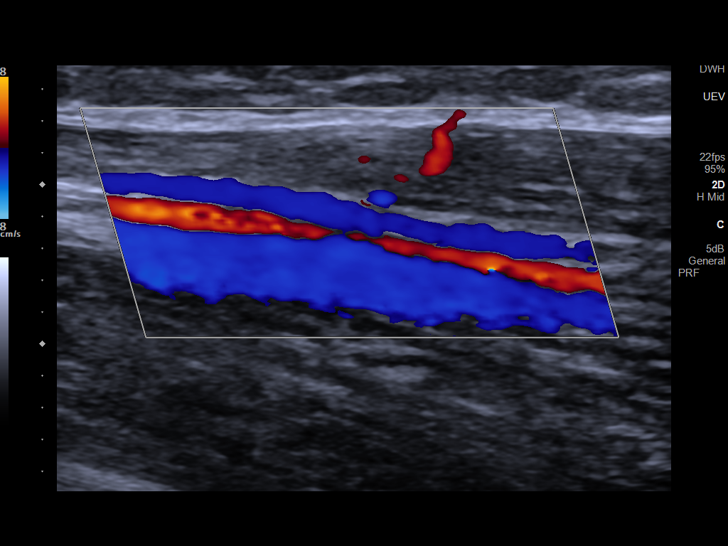

[13 of 24 positions shown; findings below may reference images not displayed]

FINDINGS: Contralateral Subclavian Vein: Respiratory phasicity is normal and
symmetric with the symptomatic side. No evidence of thrombus. Normal
compressibility.

Internal Jugular Vein: No evidence of thrombus. Normal
compressibility, respiratory phasicity and response to augmentation.

Subclavian Vein: Thrombus is noted with decreased compressibility in
the subclavian vein.

Axillary Vein: Thrombus is noted with decreased compressibility in
the axillary vein.

Cephalic Vein: Thrombus is noted within the central portion of the
cephalic vein.

Basilic Vein: No evidence of thrombus. Normal compressibility,
respiratory phasicity and response to augmentation.

Brachial Veins: No evidence of thrombus. Normal compressibility,
respiratory phasicity and response to augmentation.

Radial Veins: No evidence of thrombus. Normal compressibility,
respiratory phasicity and response to augmentation.

Ulnar Veins: No evidence of thrombus. Normal compressibility,
respiratory phasicity and response to augmentation.

Venous Reflux:  None visualized.

Other Findings:  None visualized.
IMPRESSION: Thrombus involving the left cephalic, left axillary and left
subclavian veins.
